# Patient Record
Sex: Male | Born: 1960 | Race: Black or African American | Hispanic: No | Marital: Married | Smoking: Never smoker
Health system: Southern US, Community
[De-identification: ages and names within clinical notes are randomized; demographics above are authoritative.]

---

## 2010-09-15 ENCOUNTER — Ambulatory Visit: Payer: Self-pay | Admitting: Family Medicine

## 2011-11-16 IMAGING — US US SOFT TISSUE EXCLUDE HEAD/NECK
1 series · 14 of 14 positions shown · non-contrast
Comparison: none

REASON FOR EXAM: LOW BACK MASS
COMMENTS:

[Series 1: us soft tissue exclude head/neck · 14 of 14 slices shown]
[im 1/14]
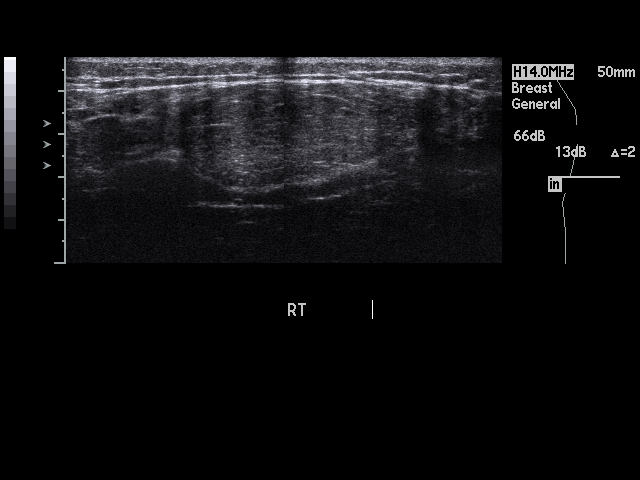
[im 2/14]
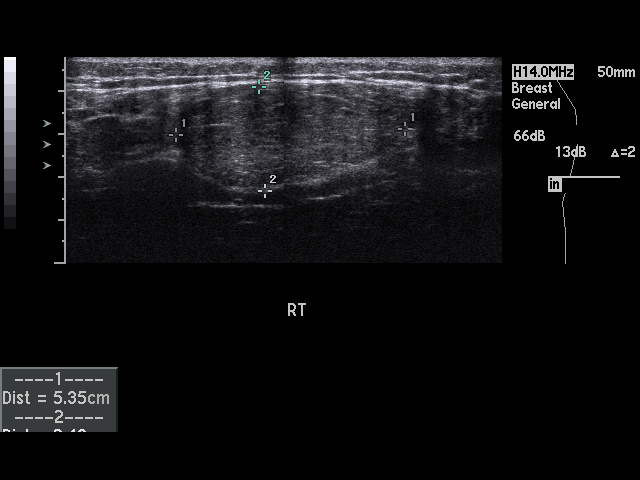
[im 3/14]
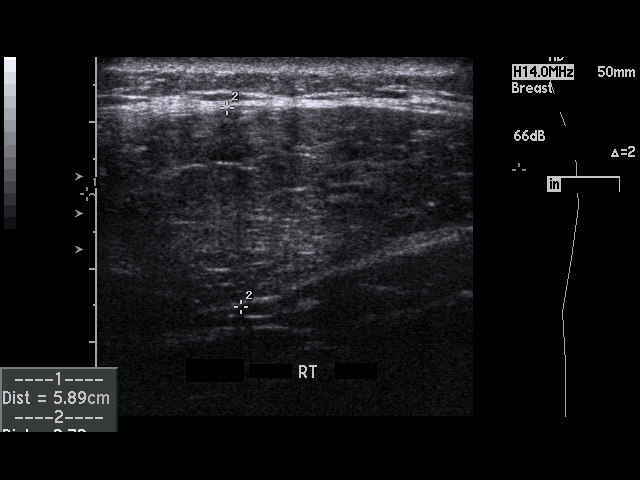
[im 4/14]
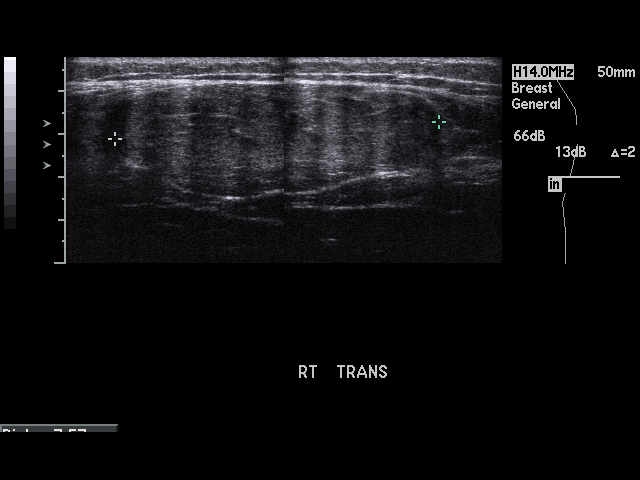
[im 5/14]
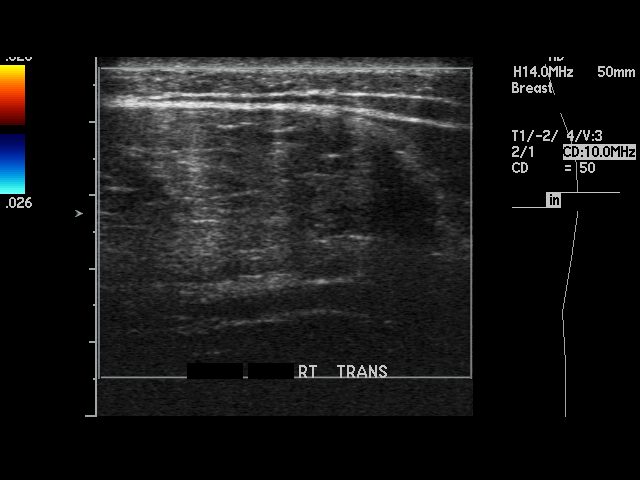
[im 6/14]
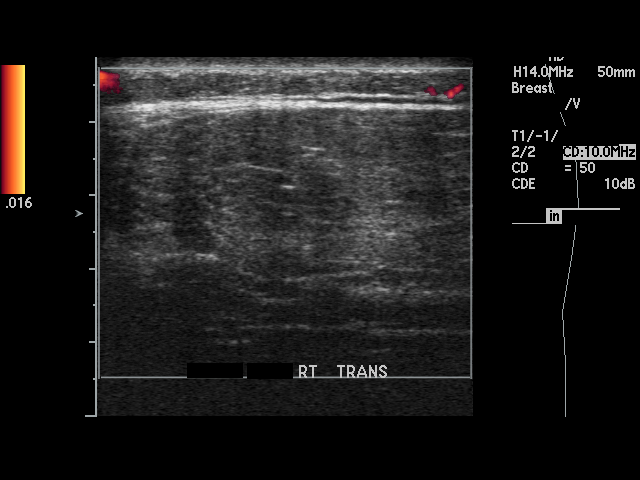
[im 7/14]
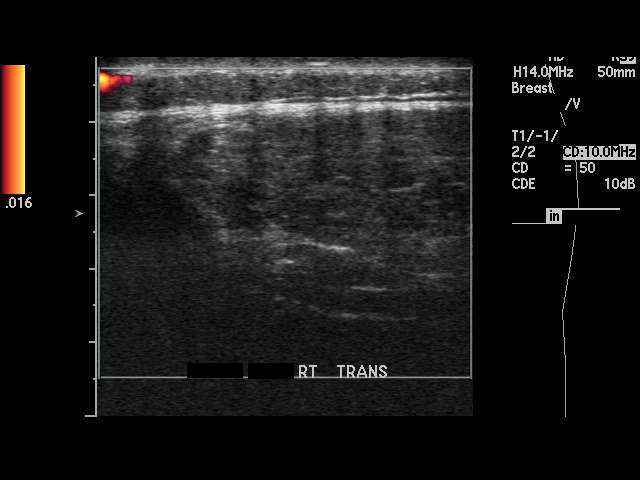
[im 8/14]
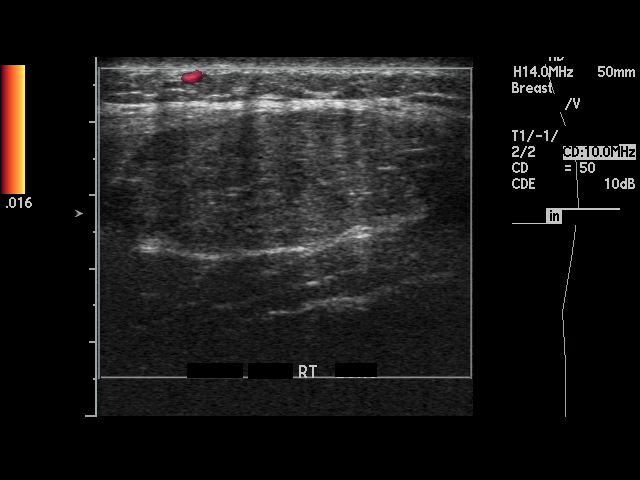
[im 9/14]
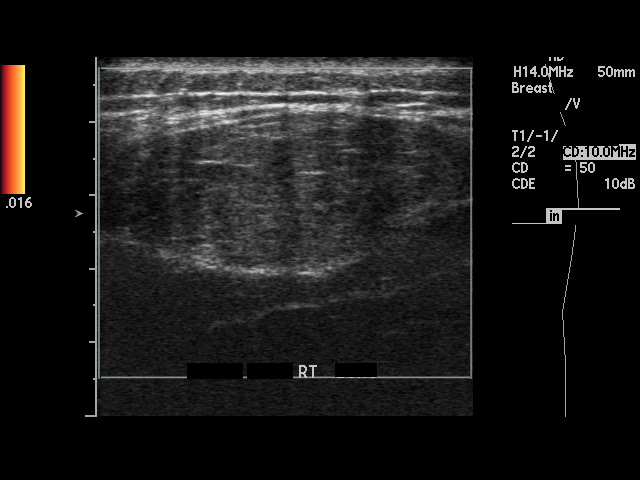
[im 10/14]
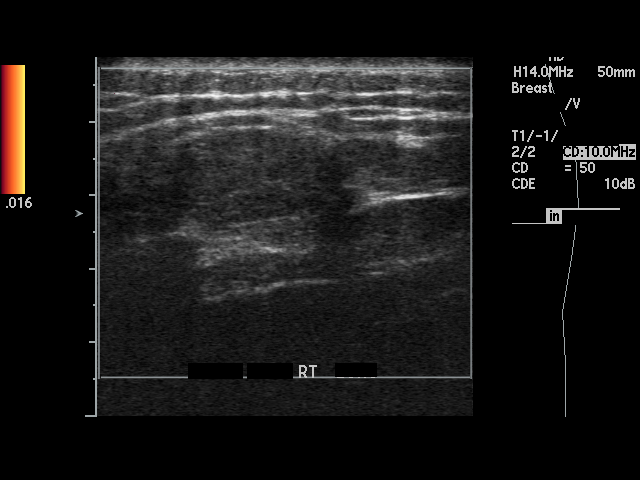
[im 11/14]
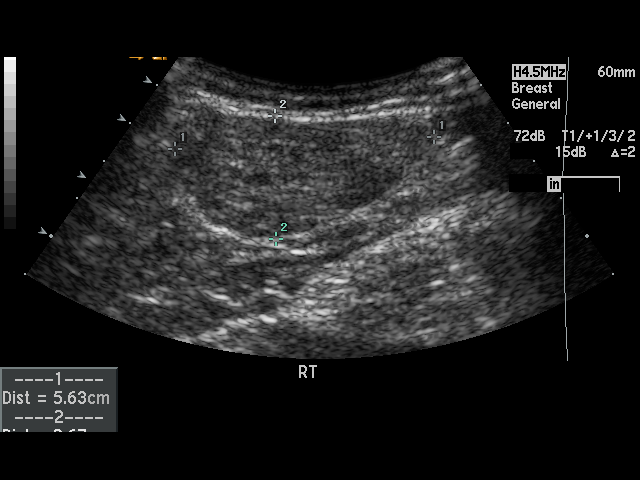
[im 12/14]
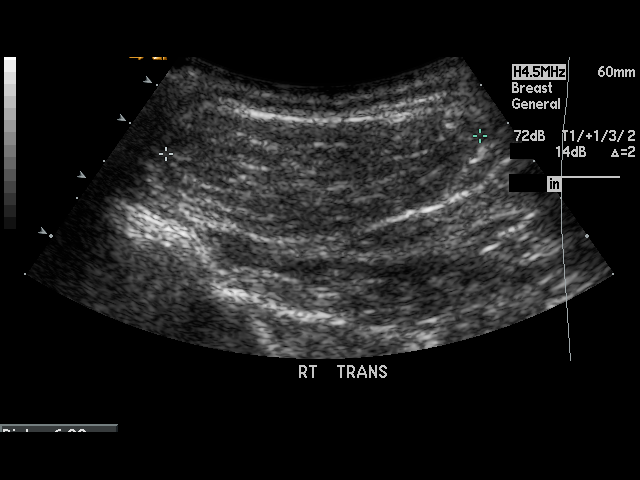
[im 13/14]
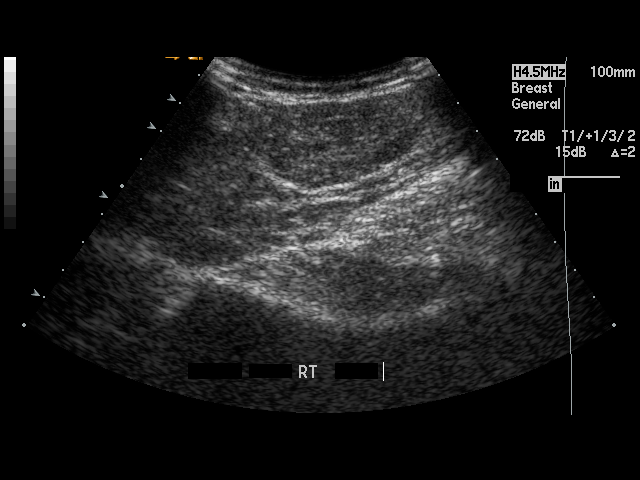
[im 14/14]
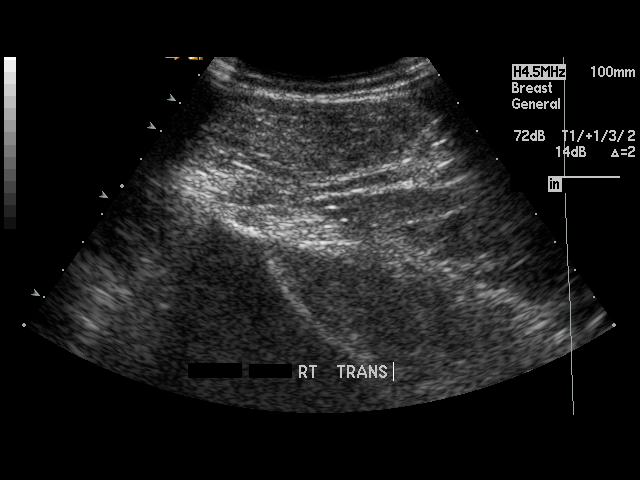

[14 of 14 positions shown; findings below may reference images not displayed]

PROCEDURE:     KLEVER - KLEVER SOFT TISSUE NOT HEAD/NECK  - September 15, 2010  [DATE]

RESULT:     The patient has a palpable area of concern at the lower back
region on the right. Ultrasound examination targeted to this area shows an
oval-shaped subcutaneous mass that measures 7.57 cm x 5.35 cm x 2.43 cm.
Although the visualized features are most compatible with a benign mass, the
histology of this mass cannot be determined accurately by ultrasound and
therefore biopsy would be needed to determine histology. No calcifications
within the mass are seen. The mass appears to be smoothly marginated and
circumscribed.
IMPRESSION: 1.     There is a solid subcutaneous mass in the lower back region. The
histology of the mass cannot be determined sonographically.

## 2020-10-19 ENCOUNTER — Ambulatory Visit (HOSPITAL_COMMUNITY)
Admission: EM | Admit: 2020-10-19 | Discharge: 2020-10-19 | Disposition: A | Discharge status: Home / Self Care | Payer: BC Managed Care – PPO | Attending: Family Medicine | Admitting: Family Medicine

## 2020-10-19 ENCOUNTER — Encounter (HOSPITAL_COMMUNITY): Payer: Self-pay | Admitting: Emergency Medicine

## 2020-10-19 ENCOUNTER — Other Ambulatory Visit: Payer: Self-pay

## 2020-10-19 DIAGNOSIS — M25461 Effusion, right knee: Secondary | ICD-10-CM | POA: Diagnosis not present

## 2020-10-19 DIAGNOSIS — M2391 Unspecified internal derangement of right knee: Secondary | ICD-10-CM

## 2020-10-19 MED ORDER — METHYLPREDNISOLONE ACETATE 80 MG/ML IJ SUSP
INTRAMUSCULAR | Status: AC
  Filled 2020-10-19: qty 1

## 2020-10-19 NOTE — ED Provider Notes (Signed)
[facility]    CSN: [id number] Arrival date & time: 10/19/20  1247      History   Chief Complaint Chief Complaint  Patient presents with  . Knee Pain    HPI Wayne Rivera is a 59 y.o. male.   HPI  Patient states he was playing basketball and twisted his knee.\ He has had knee pain and swelling ever since then.  He states he has some limited mobility in his knee.  He can walk comfortably.  He has very little pain.  He has no prior knee problems.  He states that he does have some difficulty with certain movements.  When he is sitting, he has pain with straightening his leg.  He is here hoping to have the fluid drained from his knee.  He has not taken medicine for his knee.  History reviewed. No pertinent past medical history.  There are no problems to display for this patient.   History reviewed. No pertinent surgical history.     Home Medications    Prior to Admission medications   Not on File    Family History History reviewed. No pertinent family history.  Social History Social History   Tobacco Use  . Smoking status: Never Smoker  . Smokeless tobacco: Never Used  Substance Use Topics  . Alcohol use: Yes  . Drug use: Not on file     Allergies   Patient has no allergy information on record.   Review of Systems Review of Systems  See HPI Physical Exam Triage Vital Signs ED Triage Vitals  Enc Vitals Group     BP 10/19/20 1340 (!) 167/95     Pulse Rate 10/19/20 1340 64     Resp 10/19/20 1340 17     Temp 10/19/20 1340 98.5 F (36.9 C)     Temp Source 10/19/20 1340 Oral     SpO2 10/19/20 1340 98 %     Weight --      Height --      Head Circumference --      Peak Flow --      Pain Score 10/19/20 1338 0     Pain Loc --      Pain Edu? --      Excl. in GC? --    No data found.  Updated Vital Signs BP (!) 167/95 (BP Location: Right Arm)   Pulse 64   Temp 98.5 F (36.9 C) (Oral)   Resp 17   SpO2 98%      Physical  Exam Constitutional:      General: He is not in acute distress.    Appearance: He is well-developed and normal weight.  HENT:     Head: Normocephalic and atraumatic.     Mouth/Throat:     Comments: Mask is in place Eyes:     Conjunctiva/sclera: Conjunctivae normal.     Pupils: Pupils are equal, round, and reactive to light.  Cardiovascular:     Rate and Rhythm: Normal rate.  Pulmonary:     Effort: Pulmonary effort is normal. No respiratory distress.  Abdominal:     General: There is no distension.     Palpations: Abdomen is soft.  Musculoskeletal:     Cervical back: Normal range of motion.     Right knee: Effusion present. No deformity or ecchymosis. Decreased range of motion. No tenderness. Normal alignment and normal patellar mobility.     Comments: Right knee has an obvious large effusion.  He  can flex to 90 degrees and straighten fully.  With the leg extended, he does have pain with straight leg raise.  Quadricep is palpable.  I do not feel a defect in his quadricep although he does have difficulty with straightening his leg in a seated position.  Skin:    General: Skin is warm and dry.  Neurological:     Mental Status: He is alert.     Gait: Gait abnormal.  Psychiatric:        Behavior: Behavior normal.      UC Treatments / Results  Labs (all labs ordered are listed, but only abnormal results are displayed) Labs Reviewed - No data to display  EKG   Radiology No results found.  Procedures Join Aspiration/Injection  Date/Time: 10/19/2020 5:21 PM Performed by: Eustace Moore, MD Authorized by: Eustace Moore, MD   Consent:    Consent obtained:  Verbal   Consent given by:  Patient   Risks discussed:  Incomplete drainage and infection   Alternatives discussed:  No treatment Location:    Location:  Knee Anesthesia (see MAR for exact dosages):    Anesthesia method:  Local infiltration   Local anesthetic:  Lidocaine 1% w/o epi Procedure details:     Preparation: Patient was prepped and draped in usual sterile fashion     Needle gauge:  18 G   Ultrasound guidance: no     Approach:  Lateral   Aspirate amount:  80   Aspirate characteristics:  Blood-tinged   Steroid injected: no     Specimen collected: no   Post-procedure details:    Dressing:  Adhesive bandage   Patient tolerance of procedure:  Tolerated well, no immediate complications   (including critical care time)  Medications Ordered in UC Medications - No data to display  Initial Impression / Assessment and Plan / UC Course  I have reviewed the triage vital signs and the nursing notes.  Pertinent labs & imaging results that were available during my care of the patient were reviewed by me and considered in my medical decision making (see chart for details).     *I reviewed with the patient that a bloody tap indicated he likely had internal derangement of his knee.  I recommend orthopedic follow-up.  He may need physical therapy, additional imaging, additional care. Final Clinical Impressions(s) / UC Diagnoses   Final diagnoses:  Effusion of right knee  Internal derangement of right knee     Discharge Instructions     Pur ice on knee for 20 min every couple of hours Wear wrap for a couple of days to prevent re accumulating fluid Take ibuprofen as needed for pain and inflammation See orthopedic in follow up   ED Prescriptions    None     PDMP not reviewed this encounter.   Eustace Moore, MD 10/19/20 1723

## 2020-10-19 NOTE — Discharge Instructions (Signed)
Pur ice on knee for 20 min every couple of hours Wear wrap for a couple of days to prevent re accumulating fluid Take ibuprofen as needed for pain and inflammation See orthopedic in follow up

## 2020-10-19 NOTE — ED Triage Notes (Addendum)
Pt presents with right knee pain 2 weeks ago playing basketball. States feels as if there is fluid on his knee.   Leanna Battles NP notified about pts BP
